# Patient Record
Sex: Female | Born: 1995 | Race: Black or African American | Hispanic: No | Marital: Single | Smoking: Never smoker
Health system: Southern US, Community
[De-identification: ages and names within clinical notes are randomized; demographics above are authoritative.]

## PROBLEM LIST (undated history)

## (undated) DIAGNOSIS — O4290 Premature rupture of membranes, unspecified as to length of time between rupture and onset of labor, unspecified weeks of gestation: Secondary | ICD-10-CM

## (undated) DIAGNOSIS — L309 Dermatitis, unspecified: Secondary | ICD-10-CM

## (undated) DIAGNOSIS — O209 Hemorrhage in early pregnancy, unspecified: Secondary | ICD-10-CM

## (undated) DIAGNOSIS — Z3480 Encounter for supervision of other normal pregnancy, unspecified trimester: Secondary | ICD-10-CM

## (undated) DIAGNOSIS — B951 Streptococcus, group B, as the cause of diseases classified elsewhere: Secondary | ICD-10-CM

## (undated) DIAGNOSIS — J45909 Unspecified asthma, uncomplicated: Secondary | ICD-10-CM

## (undated) DIAGNOSIS — O2340 Unspecified infection of urinary tract in pregnancy, unspecified trimester: Secondary | ICD-10-CM

## (undated) HISTORY — DX: Streptococcus, group b, as the cause of diseases classified elsewhere: B95.1

## (undated) HISTORY — DX: Hemorrhage in early pregnancy, unspecified: O20.9

## (undated) HISTORY — DX: Premature rupture of membranes, unspecified as to length of time between rupture and onset of labor, unspecified weeks of gestation: O42.90

## (undated) HISTORY — DX: Encounter for supervision of other normal pregnancy, unspecified trimester: Z34.80

## (undated) HISTORY — DX: Unspecified infection of urinary tract in pregnancy, unspecified trimester: O23.40

---
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
## 2014-05-29 ENCOUNTER — Encounter (HOSPITAL_COMMUNITY): Payer: Self-pay | Admitting: Emergency Medicine

## 2014-06-29 DIAGNOSIS — L732 Hidradenitis suppurativa: Secondary | ICD-10-CM | POA: Insufficient documentation

## 2015-08-19 IMAGING — CR DG CHEST 2V
2 series · 2 of 2 positions shown · non-contrast
Comparison: 08/12/2009

CLINICAL DATA: Left posterior rib pain for 1 day, cough and
congestion for 1 week

EXAM:
CHEST  2 VIEW

[w chest pa]
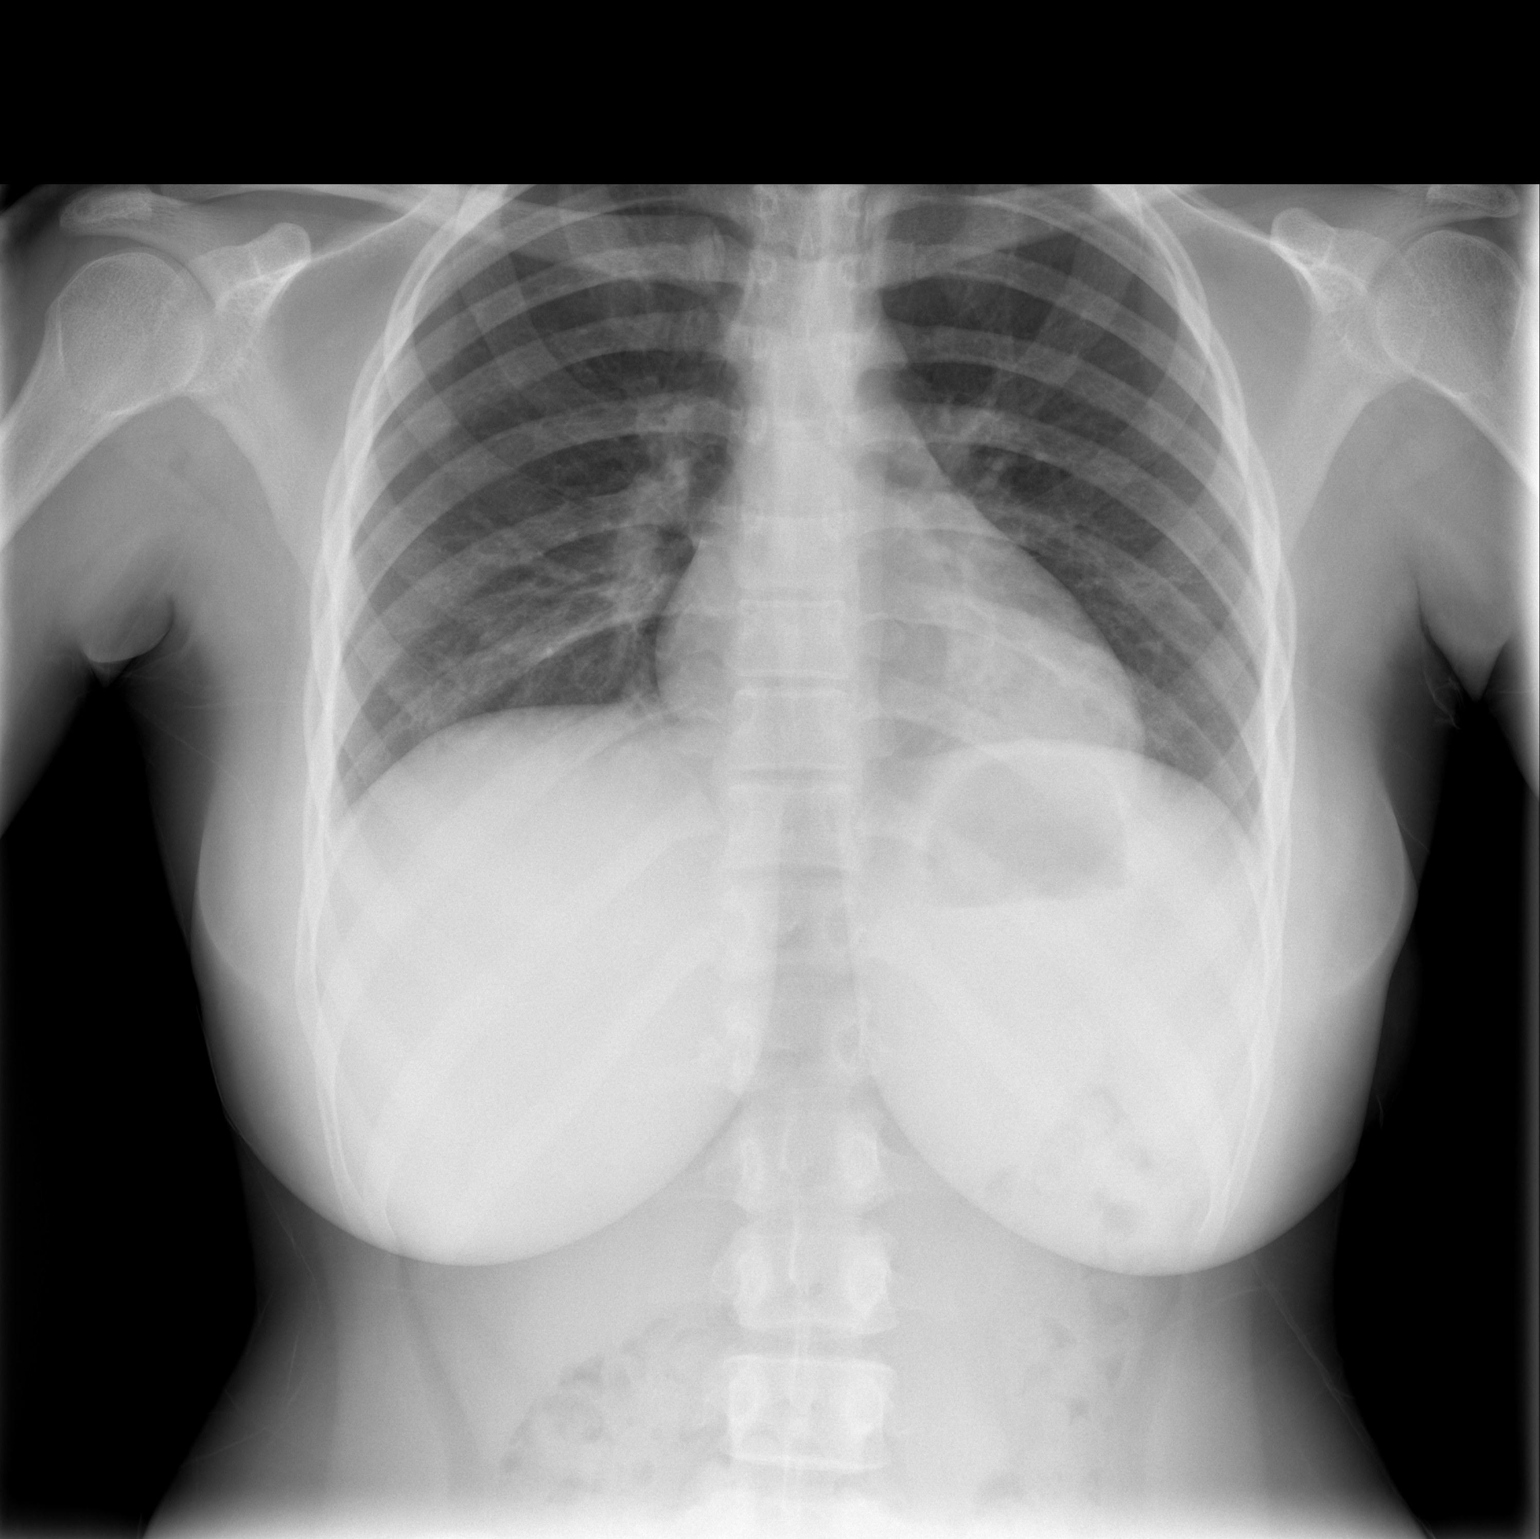

[w chest lat]
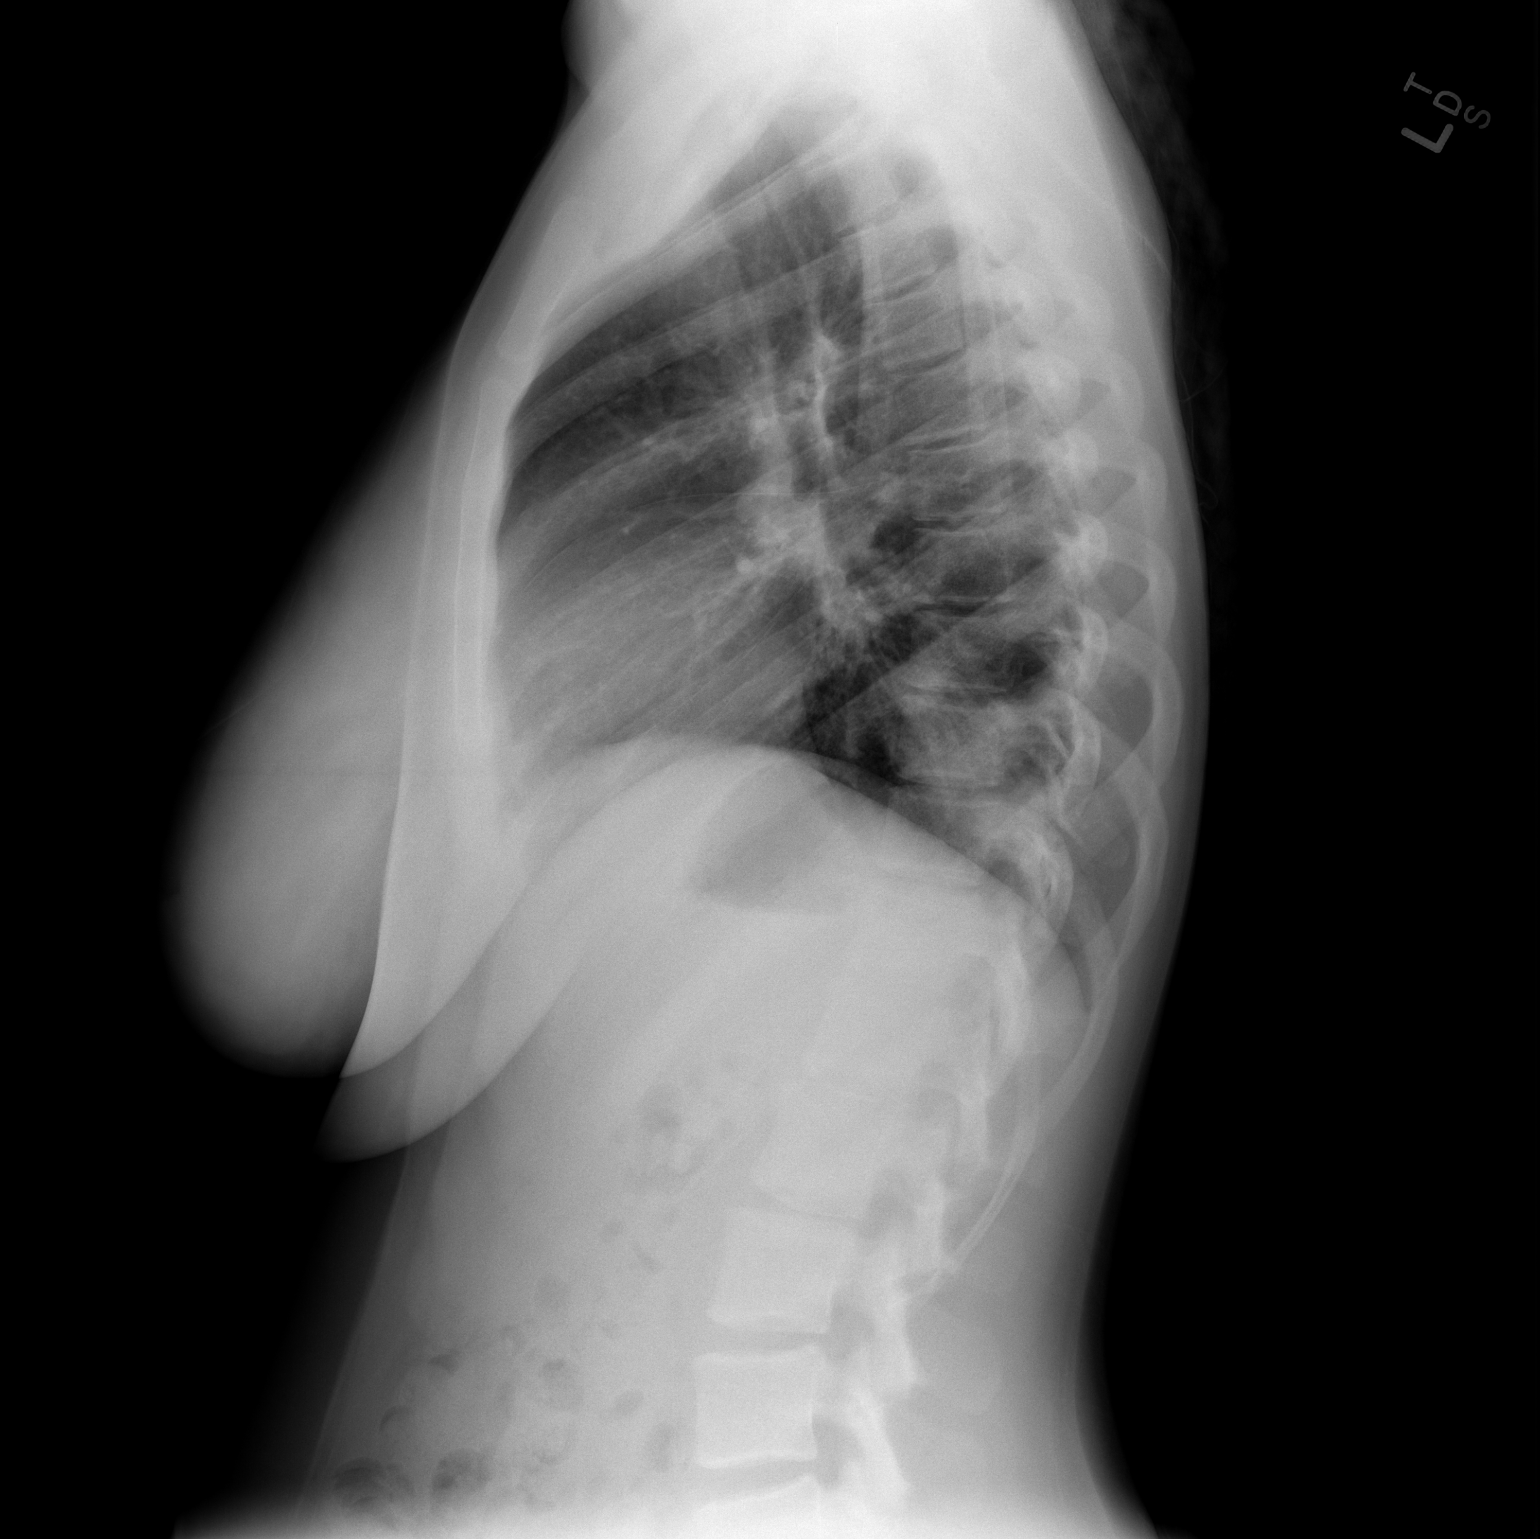

[2 of 2 positions shown; findings below may reference images not displayed]

FINDINGS: Heart size and vascular pattern are normal. There is posterior left
lower lobe consolidation. There is also subtle right middle lobe
infiltrate. There are no pleural effusions. The bony thorax is
intact.
IMPRESSION: Bilateral pneumonia

## 2015-10-22 ENCOUNTER — Encounter (HOSPITAL_COMMUNITY): Payer: Self-pay | Admitting: Emergency Medicine

## 2015-10-22 ENCOUNTER — Emergency Department (HOSPITAL_COMMUNITY)

## 2015-10-22 ENCOUNTER — Emergency Department (HOSPITAL_COMMUNITY)
Admission: EM | Admit: 2015-10-22 | Discharge: 2015-10-22 | Disposition: A | Discharge status: Home / Self Care | Attending: Emergency Medicine | Admitting: Emergency Medicine

## 2015-10-22 DIAGNOSIS — Y998 Other external cause status: Secondary | ICD-10-CM | POA: Diagnosis not present

## 2015-10-22 DIAGNOSIS — Y9389 Activity, other specified: Secondary | ICD-10-CM | POA: Diagnosis not present

## 2015-10-22 DIAGNOSIS — Z79899 Other long term (current) drug therapy: Secondary | ICD-10-CM | POA: Diagnosis not present

## 2015-10-22 DIAGNOSIS — S6992XA Unspecified injury of left wrist, hand and finger(s), initial encounter: Secondary | ICD-10-CM | POA: Diagnosis present

## 2015-10-22 DIAGNOSIS — Z872 Personal history of diseases of the skin and subcutaneous tissue: Secondary | ICD-10-CM | POA: Diagnosis not present

## 2015-10-22 DIAGNOSIS — S63045A Dislocation of carpometacarpal joint of left thumb, initial encounter: Secondary | ICD-10-CM | POA: Diagnosis not present

## 2015-10-22 DIAGNOSIS — Y9289 Other specified places as the place of occurrence of the external cause: Secondary | ICD-10-CM | POA: Insufficient documentation

## 2015-10-22 DIAGNOSIS — J45909 Unspecified asthma, uncomplicated: Secondary | ICD-10-CM | POA: Diagnosis not present

## 2015-10-22 DIAGNOSIS — X58XXXA Exposure to other specified factors, initial encounter: Secondary | ICD-10-CM | POA: Insufficient documentation

## 2015-10-22 DIAGNOSIS — S63105A Unspecified dislocation of left thumb, initial encounter: Secondary | ICD-10-CM

## 2015-10-22 MED ORDER — DIAZEPAM 5 MG/ML IJ SOLN
5.0000 mg | Freq: Once | INTRAMUSCULAR | Status: AC
  Administered 2015-10-22: 5 mg via INTRAMUSCULAR
  Filled 2015-10-22: qty 2

## 2015-10-22 MED ORDER — BUPIVACAINE HCL (PF) 0.25 % IJ SOLN
5.0000 mL | Freq: Once | INTRAMUSCULAR | Status: DC
  Filled 2015-10-22: qty 5

## 2015-10-22 MED ORDER — OXYCODONE-ACETAMINOPHEN 5-325 MG PO TABS
1.0000 | ORAL_TABLET | ORAL | Status: DC | PRN
  Administered 2015-10-22: 1 via ORAL
  Filled 2015-10-22: qty 1

## 2015-10-22 MED ORDER — LIDOCAINE HCL 2 % IJ SOLN
10.0000 mL | Freq: Once | INTRAMUSCULAR | Status: DC
  Filled 2015-10-22: qty 20

## 2015-10-22 MED ORDER — OXYCODONE-ACETAMINOPHEN 5-325 MG PO TABS
2.0000 | ORAL_TABLET | ORAL | Status: AC | PRN
  Administered 2015-10-22: 2 via ORAL
  Filled 2015-10-22: qty 2

## 2015-10-22 MED ORDER — DIAZEPAM 5 MG/ML IJ SOLN: 5.0000 mg | Freq: Once | INTRAMUSCULAR | Status: DC

## 2015-10-22 MED ORDER — TRAMADOL HCL 50 MG PO TABS: 50.0000 mg | ORAL_TABLET | Freq: Two times a day (BID) | ORAL | Status: DC | PRN

## 2015-10-22 MED ORDER — IBUPROFEN 800 MG PO TABS: 800.0000 mg | ORAL_TABLET | Freq: Three times a day (TID) | ORAL | Status: DC

## 2015-10-22 NOTE — ED Notes (Signed)
Pt states that she was playing around and dislocated her left thumb.  Obvious deformity.

## 2015-10-22 NOTE — Discharge Instructions (Signed)
Finger Dislocation Finger dislocation is the displacement of bones in your finger at the joints. Most commonly, finger dislocation occurs at the proximal interphalangeal joint (the joint closest to your knuckle). Very strong, fibrous tissues (ligaments) and joint capsules connect the three bones of your fingers.  CAUSES Dislocation is caused by a forceful impact. This impact moves these bones off the joint and often tears your ligaments.  SYMPTOMS Symptoms of finger dislocation include:  Deformity of your finger.  Pain, with loss of movement. DIAGNOSIS  Finger dislocation is diagnosed with a physical exam. Often, X-ray exams are done to see if you have associated injuries, such as bone fractures. TREATMENT  Finger dislocations are treated by putting your bones back into position (reduction) either by manually moving the bones back into place or through surgery. Your finger is then kept in a fixed position (immobilized) with the use of a dressing or splint for a brief period. When your ligament has to be surgically repaired, it needs to be kept in a fixed position with a dressing or splint for 1 to 2 weeks. Because joint stiffness is a long-term complication of finger dislocation, hand exercises or physical therapy to increase the range of motion and to regain strength is usually started as soon as the ligament is healed. Exercises and therapy generally last no more than 3 months. HOME CARE INSTRUCTIONS The following measures can help to reduce pain and speed up the healing process:  Rest your injured joint. Do not move until instructed otherwise by your caregiver. Avoid activities similar to the one that caused your injury.  Apply ice to your injured joint for the first day or 2 after your reduction or as directed by your caregiver. Applying ice helps to reduce inflammation and pain.  Put ice in a plastic bag.  Place a towel between your skin and the bag.  Leave the ice on for 15-20 minutes  at a time, every 2 hours while you are awake.  Elevate your hand above your heart as directed by your caregiver to reduce swelling.  Take over-the-counter or prescription medicine for pain as your caregiver instructs you. SEEK IMMEDIATE MEDICAL CARE IF:  Your dressing or splint becomes damaged.  Your pain becomes worse rather than better.  You lose feeling in your finger, or it becomes cold and white. MAKE SURE YOU:  Understand these instructions.  Will watch your condition.  Will get help right away if you are not doing well or get worse.   This information is not intended to replace advice given to you by your health care provider. Make sure you discuss any questions you have with your health care provider.   Document Released: 07/11/2000 Document Revised: 08/04/2014 Document Reviewed: 12/08/2014 Elsevier Interactive Patient Education 2016 Elsevier Inc.  Cast or Splint Care Casts and splints support injured limbs and keep bones from moving while they heal. It is important to care for your cast or splint at home.  HOME CARE INSTRUCTIONS  Keep the cast or splint uncovered during the drying period. It can take 24 to 48 hours to dry if it is made of plaster. A fiberglass cast will dry in less than 1 hour.  Do not rest the cast on anything harder than a pillow for the first 24 hours.  Do not put weight on your injured limb or apply pressure to the cast until your health care provider gives you permission.  Keep the cast or splint dry. Wet casts or splints can lose  their shape and may not support the limb as well. A wet cast that has lost its shape can also create harmful pressure on your skin when it dries. Also, wet skin can become infected.  Cover the cast or splint with a plastic bag when bathing or when out in the rain or snow. If the cast is on the trunk of the body, take sponge baths until the cast is removed.  If your cast does become wet, dry it with a towel or a blow  dryer on the cool setting only.  Keep your cast or splint clean. Soiled casts may be wiped with a moistened cloth.  Do not place any hard or soft foreign objects under your cast or splint, such as cotton, toilet paper, lotion, or powder.  Do not try to scratch the skin under the cast with any object. The object could get stuck inside the cast. Also, scratching could lead to an infection. If itching is a problem, use a blow dryer on a cool setting to relieve discomfort.  Do not trim or cut your cast or remove padding from inside of it.  Exercise all joints next to the injury that are not immobilized by the cast or splint. For example, if you have a long leg cast, exercise the hip joint and toes. If you have an arm cast or splint, exercise the shoulder, elbow, thumb, and fingers.  Elevate your injured arm or leg on 1 or 2 pillows for the first 1 to 3 days to decrease swelling and pain.It is best if you can comfortably elevate your cast so it is higher than your heart. SEEK MEDICAL CARE IF:   Your cast or splint cracks.  Your cast or splint is too tight or too loose.  You have unbearable itching inside the cast.  Your cast becomes wet or develops a soft spot or area.  You have a bad smell coming from inside your cast.  You get an object stuck under your cast.  Your skin around the cast becomes red or raw.  You have new pain or worsening pain after the cast has been applied. SEEK IMMEDIATE MEDICAL CARE IF:   You have fluid leaking through the cast.  You are unable to move your fingers or toes.  You have discolored (blue or white), cool, painful, or very swollen fingers or toes beyond the cast.  You have tingling or numbness around the injured area.  You have severe pain or pressure under the cast.  You have any difficulty with your breathing or have shortness of breath.  You have chest pain.   This information is not intended to replace advice given to you by your health  care provider. Make sure you discuss any questions you have with your health care provider.   Document Released: 07/11/2000 Document Revised: 05/04/2013 Document Reviewed: 01/20/2013 Elsevier Interactive Patient Education 2016 Elsevier Inc.   RICE for Routine Care of Injuries Theroutine careofmanyinjuriesincludes rest, ice, compression, and elevation (RICE therapy). RICE therapy is often recommended for injuries to soft tissues, such as a muscle strain, ligament injuries, bruises, and overuse injuries. It can also be used for some bony injuries. Using RICE therapy can help to relieve pain, lessen swelling, and enable your body to heal. Rest Rest is required to allow your body to heal. This usually involves reducing your normal activities and avoiding use of the injured part of your body. Generally, you can return to your normal activities when you are comfortable and  have been given permission by your health care provider. Ice Icing your injury helps to keep the swelling down, and it lessens pain. Do not apply ice directly to your skin.  Put ice in a plastic bag.  Place a towel between your skin and the bag.  Leave the ice on for 20 minutes, 2-3 times a day. Do this for as long as you are directed by your health care provider. Compression Compression means putting pressure on the injured area. Compression helps to keep swelling down, gives support, and helps with discomfort. Compression may be done with an elastic bandage. If an elastic bandage has been applied, follow these general tips:  Remove and reapply the bandage every 3-4 hours or as directed by your health care provider.  Make sure the bandage is not wrapped too tightly, because this can cut off circulation. If part of your body beyond the bandage becomes blue, numb, cold, swollen, or more painful, your bandage is most likely too tight. If this occurs, remove your bandage and reapply it more loosely.  See your health care  provider if the bandage seems to be making your problems worse rather than better. Elevation Elevation means keeping the injured area raised. This helps to lessen swelling and decrease pain. If possible, your injured area should be elevated at or above the level of your heart or the center of your chest. WHEN SHOULD I SEEK MEDICAL CARE? You should seek medical care if:  Your pain and swelling continue.  Your symptoms are getting worse rather than improving. These symptoms may indicate that further evaluation or further X-rays are needed. Sometimes, X-rays may not show a small broken bone (fracture) until a number of days later. Make a follow-up appointment with your health care provider. WHEN SHOULD I SEEK IMMEDIATE MEDICAL CARE? You should seek immediate medical care if:  You have sudden severe pain at or below the area of your injury.  You have redness or increased swelling around your injury.  You have tingling or numbness at or below the area of your injury that does not improve after you remove the elastic bandage.   This information is not intended to replace advice given to you by your health care provider. Make sure you discuss any questions you have with your health care provider.   Document Released: 10/26/2000 Document Revised: 04/04/2015 Document Reviewed: 06/21/2014 Elsevier Interactive Patient Education [employer]2016 Elsevier Inc.

## 2015-10-22 NOTE — ED Provider Notes (Signed)
CSN: [id number]     Arrival date & time 10/22/15  1223 History  By signing my name below, I, [facility], attest that this documentation has been prepared under the direction and in the presence of Danelle Berry, PA-C. Electronically Signed: Randell Patient, ED Scribe. 10/22/2015. 7:18 PM.   Chief Complaint  Patient presents with  . Hand Injury   The history is provided by the patient. No language interpreter was used.   HPI Comments: Samantha Townsend is a 20 y.o. female who presents to the Emergency Department complaining of constant, sharp, 10/10 left thumb pain onset earlier today after an injury. Patient reports that she was playing around when he hand was truck, causing her thumb to bend backwards, followed immediately by pain. She endorses associated mild numbness to thumb. Pain worse with movement of the wrist and any light touch to thumb. Denies seeing an orthopedist in the past. Denies weakness and any other symptoms.  Past Medical History  Diagnosis Date  . Eczema   . Asthma     NO PCP   No past surgical history on file. Family History  Problem Relation Age of Onset  . Asthma Father   . Alcohol abuse Father   . Asthma Other   . Birth defects Cousin     DEFORMITY OF BACK  . Mental retardation Cousin    Social History  Substance Use Topics  . Smoking status: Never Smoker   . Smokeless tobacco: Never Used  . Alcohol Use: No   OB History    Gravida Para Term Preterm AB TAB SAB Ectopic Multiple Living   Review of Systems A complete 10 system review of systems was obtained and all systems are negative except as noted in the HPI and PMH.    Allergies  Review of patient's allergies indicates no known allergies.  Home Medications   Prior to Admission medications   Medication Sig Start Date End Date Taking? Authorizing Provider  albuterol (PROVENTIL HFA;VENTOLIN HFA) 108 (90 BASE) MCG/ACT inhaler Inhale 2 puffs into the lungs every 4 (four)  hours as needed for wheezing or shortness of breath. 06/13/13   Nicole Pisciotta, PA-C  azithromycin (ZITHROMAX) 250 MG tablet Take 1 tablet (250 mg total) by mouth daily. Take first 2 tablets together, then 1 every day until finished. 07/31/13   Trixie Dredge, PA-C  HYDROcodone-acetaminophen (NORCO/VICODIN) 5-325 MG per tablet Take 1-2 tablets by mouth every 4 (four) hours as needed. 07/31/13   Trixie Dredge, PA-C  ibuprofen (ADVIL,MOTRIN) 200 MG tablet Take 200 mg by mouth every 6 (six) hours as needed for pain.    Historical Provider, MD  ibuprofen (ADVIL,MOTRIN) 800 MG tablet Take 1 tablet (800 mg total) by mouth every 8 (eight) hours as needed. 07/31/13   Trixie Dredge, PA-C   LMP 10/02/2015 Physical Exam  Constitutional: She is oriented to person, place, and time. She appears well-developed and well-nourished. No distress.  Pt appears anxious and uncomfortable  HENT:  Head: Normocephalic and atraumatic.  Nose: Nose normal.  Eyes: Conjunctivae and EOM are normal. Pupils are equal, round, and reactive to light. Right eye exhibits no discharge. Left eye exhibits no discharge. No scleral icterus.  Neck: Normal range of motion. Neck supple. No JVD present. No tracheal deviation present.  Cardiovascular: Normal rate and regular rhythm.   Radial pulse 2+ bilaterally, normal capillary refill  Pulmonary/Chest: Effort normal and breath sounds normal. No  stridor. No respiratory distress.  Musculoskeletal: She exhibits tenderness.       Left hand: She exhibits decreased range of motion, tenderness, bony tenderness, disruption of two-point discrimination and deformity. She exhibits normal capillary refill, no laceration and no swelling. Decreased strength noted.       Hands: Lymphadenopathy:    She has no cervical adenopathy.  Neurological: She is alert and oriented to person, place, and time. She exhibits normal muscle tone. Coordination normal.  Skin: Skin is warm. No rash noted. She is not diaphoretic. No  erythema. No pallor.  Psychiatric: She has a normal mood and affect. Her behavior is normal. Judgment and thought content normal.  Nursing note and vitals reviewed.     ED Course  Procedures   DIAGNOSTIC STUDIES: Oxygen Saturation is 100% on RA, normal by my interpretation.    COORDINATION OF CARE: 6:29 PM Will order pain medication. Will consult with attending MD Dr. Anitra LauthPlunkett. Discussed treatment plan with pt at bedside and pt agreed to plan.  7:17 PM Consulted with attending. Returned to perform thumb reduction. Will order finger splint. Will prescribe ibuprofen and Ultram.  Imaging Review Dg Finger Thumb Left  10/22/2015  CLINICAL DATA:  Pain following fall EXAM: LEFT THUMB 2+V COMPARISON:  None. FINDINGS: Frontal, oblique, and lateral views were obtained. There is dislocation at the first MCP joint with the proximal phalanx displaced laterally and somewhat volar with respect to the first metacarpal. No fracture evident. No appreciable arthropathic change. IMPRESSION: Dislocation at the first MCP joint. No fracture evident. No appreciable arthropathic change. Electronically Signed   By: Bretta BangWilliam  Woodruff III M.D.   On: 10/22/2015 14:58   I have personally reviewed and evaluated these images as part of my medical decision-making.    MDM   Final diagnoses:  Thumb dislocation, left, initial encounter      Gwyneth SproutWhitney Damarious Holtsclaw, MD 10/31/15 2119

## 2015-10-22 NOTE — ED Provider Notes (Signed)
CSN: [id number]     Arrival date & time 10/22/15  1223 History   First MD Initiated Contact with Patient 10/22/15 1828     Chief Complaint  Patient presents with  . Hand Injury     (Consider location/radiation/quality/duration/timing/severity/associated sxs/prior Treatment) HPI  Past Medical History  Diagnosis Date  . Eczema   . Asthma     NO PCP   No past surgical history on file. Family History  Problem Relation Age of Onset  . Asthma Father   . Alcohol abuse Father   . Asthma Other   . Birth defects Cousin     DEFORMITY OF BACK  . Mental retardation Cousin    Social History  Substance Use Topics  . Smoking status: Never Smoker   . Smokeless tobacco: Never Used  . Alcohol Use: No   OB History    Gravida Para Term Preterm AB TAB SAB Ectopic Multiple Living   1 1 1       1      Review of Systems    Allergies  Review of patient's allergies indicates no known allergies.  Home Medications   Prior to Admission medications   Medication Sig Start Date End Date Taking? Authorizing Provider  albuterol (PROVENTIL HFA;VENTOLIN HFA) 108 (90 BASE) MCG/ACT inhaler Inhale 2 puffs into the lungs every 4 (four) hours as needed for wheezing or shortness of breath. 06/13/13   Nicole Pisciotta, PA-C  azithromycin (ZITHROMAX) 250 MG tablet Take 1 tablet (250 mg total) by mouth daily. Take first 2 tablets together, then 1 every day until finished. 07/31/13   Trixie DredgeEmily West, PA-C  HYDROcodone-acetaminophen (NORCO/VICODIN) 5-325 MG per tablet Take 1-2 tablets by mouth every 4 (four) hours as needed. 07/31/13   Trixie DredgeEmily West, PA-C  ibuprofen (ADVIL,MOTRIN) 800 MG tablet Take 1 tablet (800 mg total) by mouth 3 (three) times daily. 10/22/15   Danelle BerryLeisa Tapia, PA-C  traMADol (ULTRAM) 50 MG tablet Take 1 tablet (50 mg total) by mouth every 12 (twelve) hours as needed for severe pain. 10/22/15   Danelle BerryLeisa Tapia, PA-C   BP 129/80 mmHg  Pulse 50  Resp 18  SpO2 100%  LMP 10/02/2015 Physical Exam  ED Course   Reduction of dislocation Date/Time: 10/22/2015 7:37 PM Performed by: Gwyneth SproutPLUNKETT, Esli Clements Authorized by: Gwyneth SproutPLUNKETT, Pierre Dellarocco Consent: Verbal consent obtained. Risks and benefits: risks, benefits and alternatives were discussed Consent given by: patient Patient identity confirmed: verbally with patient Local anesthesia used: yes Anesthesia: digital block Local anesthetic: lidocaine 1% without epinephrine Anesthetic total: 3 ml Patient sedated: no Patient tolerance: Patient tolerated the procedure well with no immediate complications Comments: With traction on the thumb and full flexion joint reduced.  Able to wiggle the finger and normal cap refill.   (including critical care time) Labs Review Labs Reviewed - No data to display  Imaging Review Dg Finger Thumb Left  10/22/2015  CLINICAL DATA:  Pain following fall EXAM: LEFT THUMB 2+V COMPARISON:  None. FINDINGS: Frontal, oblique, and lateral views were obtained. There is dislocation at the first MCP joint with the proximal phalanx displaced laterally and somewhat volar with respect to the first metacarpal. No fracture evident. No appreciable arthropathic change. IMPRESSION: Dislocation at the first MCP joint. No fracture evident. No appreciable arthropathic change. Electronically Signed   By: Bretta BangWilliam  Woodruff III M.D.   On: 10/22/2015 14:58   I have personally reviewed and evaluated these images and lab results as part of my medical decision-making.   EKG Interpretation None  MDM   Final diagnoses:  Thumb dislocation, left, initial encounter        Gwyneth Sprout, MD 10/22/15 [id number]

## 2016-10-27 ENCOUNTER — Ambulatory Visit (INDEPENDENT_AMBULATORY_CARE_PROVIDER_SITE_OTHER): Admitting: Physician Assistant

## 2016-10-27 VITALS — BP 115/69 | HR 55 | Temp 97.7°F | Resp 14 | Ht 65.0 in | Wt 141.0 lb

## 2016-10-27 DIAGNOSIS — Z9889 Other specified postprocedural states: Secondary | ICD-10-CM | POA: Diagnosis not present

## 2016-10-27 DIAGNOSIS — Z4689 Encounter for fitting and adjustment of other specified devices: Secondary | ICD-10-CM

## 2016-10-27 DIAGNOSIS — M79645 Pain in left finger(s): Secondary | ICD-10-CM

## 2016-10-27 NOTE — Progress Notes (Signed)
Patient reports she fell in March 31 of 2017. Patient had surgery January 31 of 2018. Patient reports it took her a while to get into surgery because the [employer] refused to pay for surgery until she went through other modalities such as immobilization and physical therapy.   Patient was in a cast for 2 weeks then was placed into a splint. Patient then re-injured the thumb pulling groceries on March 19th. Patient was then put back into a cast on March 20th and is still in one today. Patient reports she was going to get the cast taken off tomorrow by her orthopedic surgeon but she is leaving town for the [employer] so they told her to keep her cast off until she returns on April 12th. She is here today to get her cast cut off or replaced.

## 2016-10-27 NOTE — Progress Notes (Signed)
Patient ID: Samantha Townsend, female     DOB: August 25, 1995, 21 y.o.    MRN: [id number]  PCP: No PCP Per Patient  Chief Complaint  Patient presents with  . Hand Problem    had surgery on left thumb Jan 31st now has wet cast wants changed     Subjective:   This patient is new to this practice and presents for removal of a cast on the LEFT forearm.  She injured the ligaments when the LEFT thumb was dislocated in 09/2015. After pain and reduced function were not amenable to conservative treatment, she underwent surgical intervention at [facility], where she is [facility] in the [place] [employer], on 08/27/2016.  She recovered well following surgery and was no longer needing to use a splint when she re-injured the thumb on 10/13/2016 lifting a grocery bag. A new cast was placed to provide better immobilization than a removable splint, and she was to follow-up for removal of the cast on 10/28/2016. Because she is currently on leave, and here in [place], the appointment was rescheduled for 11/06/2016. She reports that the plan was to resume the thumb spica splint and PT.  Yesterday she was playing with friends/family and someone threw a water balloon at her. The cast is wet inside and the odor has worsened. No numbness or tingling in the fingers or hand. No pain.    Review of Systems As above.  Prior to Admission medications   Not on File     No Known Allergies   Patient Active Problem List   Diagnosis Date Noted  . History of thumb surgery 10/27/2016  . Hidradenitis suppurativa 06/29/2014  . Not immune to rubella 08/20/2012  . ASTHMA, UNSPECIFIED 09/24/2006  . ECZEMA, ATOPIC DERMATITIS 09/24/2006     Family History  Problem Relation Age of Onset  . Asthma Father   . Alcohol abuse Father   . Asthma Other   . Birth defects Cousin     DEFORMITY OF BACK  . Mental retardation Cousin      Social History   Social History  . Marital status: Single    Spouse name: N/A  . Number  of children: N/A  . Years of education: 11+   Occupational History  . STUDENT Unemployed   Social History Main Topics  . Smoking status: Never Smoker  . Smokeless tobacco: Never Used  . Alcohol use No  . Drug use: No  . Sexual activity: No   Other Topics Concern  . Not on file   Social History Narrative  . No narrative on file         Objective:  Physical Exam  Constitutional: She is oriented to person, place, and time. She appears well-developed and well-nourished. She is active and cooperative. No distress.  BP 115/69   Pulse (!) 55   Temp 97.7 F (36.5 C)   Resp 14   Ht  (1.651 m)   Wt 141 lb (64 kg)   LMP 10/02/2016 (Exact Date)   SpO2 99%   BMI 23.46 kg/m    Eyes: Conjunctivae are normal.  Pulmonary/Chest: Effort normal.  Neurological: She is alert and oriented to person, place, and time.  Psychiatric: She has a normal mood and affect. Her speech is normal and behavior is normal.    Cast removed without incident. The patient washed and dried her hand/arm. She has reduced ROM of the LEFT thumb, consistent with recent immobilization, and not more than she  experienced following the cast removal initially. Tenderness at the surgical site, which is well healed, but not more than she reported was there before the recurrent injury. Good radial pulse. Capillary refill <3 seconds.          Assessment & Plan:   Problem List Items Addressed This Visit    History of thumb surgery    Other Visit Diagnoses    Thumb pain, left    -  Primary   Thumb spica splint placed. Patient advised to follow-up with her specialist as soon as she returns to [facility].   Relevant Orders   Apply / replace cast (Completed)   Encounter for cast removal           Return if symptoms worsen or fail to improve.   Fernande Bras, PA-C Primary Care at [facility]

## 2016-10-27 NOTE — Progress Notes (Signed)
THIS NOTE IS USED FOR EDUCATIONAL PURPOSES ONLY!!!   CC: Cast removal  PCP: No PCP Per Patient  HPI: Patient is currently in the [employer] and is stationed at [employer] in [place]. Patient reports she fell in March 31 of 2017 and dislocated her left thumb. Patient had surgery January 31 of 2018. Patient reports it took her a while to get into surgery because the [employer] refused to pay for surgery until she went through other modalities such as immobilization and physical therapy. Patient reports her surgery was solely for ligamentous purposes. No fracture.   Patient was in a cast for 2 weeks then was placed into a splint. Patient then re-injured the thumb pulling groceries on March 19th 2018. Patient was then put back into a cast on March 20th 2018 and is still in one today. Patient reports she was going to get the cast taken off tomorrow by her orthopedic surgeon but she is leaving town for the [employer] so they told her to keep her cast off until she returns on April 12th. She is here today to get her cast cut off because she got it wet yesterday and she feels like it needs to be replaced or removed.    ROS: Denies numbness/tingling in the fingers/hand. Reports good strength and sensation of fingers in her left hand.   Allergies: No Known Allergies  PE:  GS: WDWN female sitting on exam table in NAD.  Vitals: BP 115/69   Pulse (!) 55   Temp 97.7 F (36.5 C)   Resp 14   Ht  (1.651 m)   Wt 141 lb (64 kg)   LMP 10/02/2016 (Exact Date)   SpO2 99%   BMI 23.46 kg/m  HEENT: Normocephalic, atruamatic. PEARRL Cardiovascular: RRR. No S3 or S4. No murmurs, rubs, or gallops. Pulses 2+ and equal bilateral in the upper and lower extremities. No pitting edema. No varicosities, clubbing, or cyanosis. Capillary refill <2 seconds in the upper extremity.  Pulm: CTA bilaterally. No expiratory muscle use while breathing.  GI: +BS. NTND. No rigidity or guarding. No rebound tenderness.  MSK: With  cast taken off: Sensation intact of the upper extremity bilaterally. Strength 4/5 at thumb. Capillary refill <2 sec. 3 cm round ecchymosis of the medial aspect of the left thumb. Decreased ROM with opposition, flexion and extension.  Neuro: CN 2-12 grossly intact.  Psych: A&O x 4. Mood and affect appropriate for situation.  Skin: Warm and dry.   A&P:  Thumb pain, left - Plan: Remove cast. Apply thumb splint. Wear splint at all times except bathing until surgery follow up. Follow up with surgen in 2 weeks. F/u in our office PRN.   History of thumb surgery  Encounter for cast removal      Respectfully,  Camillia Herter, PA-S2

## 2016-10-27 NOTE — Patient Instructions (Addendum)
You may remove the splint for bathing and hand washing.  Follow-up with the orthopedic surgeon as soon as you can.    IF you received an x-ray today, you will receive an invoice from [facility]. Please contact [facility] at [phone] with questions or concerns regarding your invoice.   IF you received labwork today, you will receive an invoice from [place]. Please contact [facility] at [phone] with questions or concerns regarding your invoice.   Our billing staff will not be able to assist you with questions regarding bills from these companies.  You will be contacted with the lab results as soon as they are available. The fastest way to get your results is to activate your My Chart account. Instructions are located on the last page of this paperwork. If you have not heard from [place] regarding the results in 2 weeks, please contact this office.
